# Patient Record
Sex: Female | Born: 2005 | Race: White | Hispanic: No | Marital: Single | Smoking: Never smoker
Health system: Southern US, Community
[De-identification: ages and names within clinical notes are randomized; demographics above are authoritative.]

## PROBLEM LIST (undated history)

## (undated) DIAGNOSIS — Z20828 Contact with and (suspected) exposure to other viral communicable diseases: Secondary | ICD-10-CM

---
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
## 2023-03-23 DIAGNOSIS — J301 Allergic rhinitis due to pollen: Secondary | ICD-10-CM | POA: Insufficient documentation

## 2023-10-16 ENCOUNTER — Ambulatory Visit
Admission: EM | Admit: 2023-10-16 | Discharge: 2023-10-16 | Disposition: A | Discharge status: Home / Self Care | Attending: Emergency Medicine | Admitting: Emergency Medicine

## 2023-10-16 ENCOUNTER — Ambulatory Visit (INDEPENDENT_AMBULATORY_CARE_PROVIDER_SITE_OTHER)

## 2023-10-16 DIAGNOSIS — S92514A Nondisplaced fracture of proximal phalanx of right lesser toe(s), initial encounter for closed fracture: Secondary | ICD-10-CM

## 2023-10-16 DIAGNOSIS — S92421A Displaced fracture of distal phalanx of right great toe, initial encounter for closed fracture: Secondary | ICD-10-CM

## 2023-10-16 DIAGNOSIS — S9031XA Contusion of right foot, initial encounter: Secondary | ICD-10-CM

## 2023-10-16 DIAGNOSIS — T148XXA Other injury of unspecified body region, initial encounter: Secondary | ICD-10-CM

## 2023-10-16 MED ORDER — CEPHALEXIN 500 MG PO CAPS: 1000.0000 mg | ORAL_CAPSULE | Freq: Two times a day (BID) | ORAL | 0 refills | Status: AC

## 2023-10-16 MED ORDER — MUPIROCIN 2 % EX OINT: 1.0000 | TOPICAL_OINTMENT | Freq: Two times a day (BID) | CUTANEOUS | 0 refills | Status: AC

## 2023-10-16 NOTE — ED Provider Notes (Signed)
 HPI  SUBJECTIVE:  Stacey Villarreal is a 18 y.o. female who presents with pain, swelling, bruising of her right 1st and 2nd toes after dropping 300 pounds metal cylinder while wearing soft shoes while at work on 4/18.  She reports a blister that is new on the dorsum of her foot.  She has been taking Naprosyn with fairly good pain control.  She is also using  crutches.  Was seen in the ED/[facility] urgent care on 4/18 on the day of injury.  Per chart, x-ray shows comminuted tuft fracture distal right first phalanx.  Second phalanx normal.  She was referred to orthopedics and she was sent home with Naprosyn, open toed postop shoe.  She states that she cannot wear the postop shoe at work, but needs a boot, which she states has been cleared by work.  This is not a Financial risk analyst case.  Past Medical History:  Diagnosis Date   Mono exposure     History reviewed. No pertinent surgical history.  History reviewed. No pertinent family history.  Social History   Tobacco Use   Smoking status: Never   Smokeless tobacco: Never  Substance Use Topics   Alcohol use: No    No current facility-administered medications for this encounter.  Current Outpatient Medications:    cetirizine (ZYRTEC) 5 MG tablet, Take 5 mg by mouth., Disp: , Rfl:    etonogestrel (NEXPLANON) 68 MG IMPL implant, , Disp: , Rfl:    cefdinir (OMNICEF) 250 MG/5ML suspension, Take by mouth 2 (two) times daily., Disp: , Rfl:   Allergies  Allergen Reactions   Bee Venom Dermatitis     ROS  As noted in HPI.   Physical Exam  BP 112/79 (BP Location: Right Arm)   Pulse 71   Temp 98.7 F (37.1 C) (Oral)   Resp 15   SpO2 97%  *** Constitutional: Well developed, well nourished, no acute distress Eyes:  EOMI, conjunctiva normal bilaterally HENT: Normocephalic, atraumatic,mucus membranes moist Respiratory: Normal inspiratory effort Cardiovascular: Normal rate GI: nondistended skin: Tender traumatic blister dorsum first  right toe Musculoskeletal: Right first MTP, 1st and 2nd toes tender, bruised.  Tenderness on the dorsum and plantar aspect of the foot along 1st and 2nd toes.  Sensation decreased, but intact.  Cap refill less than 2 seconds in all toes.  No other tenderness over the rest of the foot  Neurologic: Alert & oriented x 3, no focal neuro deficits Psychiatric: Speech and behavior appropriate   ED Course   Medications - No data to display  Orders Placed This Encounter  Procedures   DG Foot Complete Right    Standing Status:   Standing    Number of Occurrences:   1    Reason for Exam (SYMPTOM  OR DIAGNOSIS REQUIRED):   Blunt trauma right first MTP, 1st and 2nd toe with extensive bruising.  Rule out fracture.    No results found for this or any previous visit (from the past 24 hours). No results found.  ED Clinical Impression  1. Contusion of right foot, initial encounter      ED Assessment/Plan   {The patient has not been seen in Urgent Care in the last 3 years. :1}   Outside records reviewed.  As noted in HPI.  Unable to actually review images from outside urgent care/ED.  Will repeat films to make sure nothing has changed.  Reviewed imaging independently.,  Comminuted tuft fracture distal phalanx, possible fracture of the base of proximal phalanx  second toe only seen on the lateral view as read by me.  Discussed with patient that formal radiology overread is pending.  Will contact patient at ***if radiology overread differs from mine and we need to change management.  Reviewed radiology report.  Comminuted tuft fracture distal phalanx first toe consistent with my read.  See radiology report for full details.  Patient states the Naprosyn is working well for her.  Will add Tylenol to this.  Keflex  1000 mg p.o. twice daily for 5 days and Bactroban  for the traumatic blister to prevent infection.  Placing in cam walker.  Will put in referral to [facility].  Discussed labs,  imaging, MDM, treatment plan, and plan for follow-up with {Blank single:19197::"family","parent","patient"}. Discussed sn/sx that should prompt return to the ED. {Blank single:19197::"family","parent","patient"} agrees with plan.   No orders of the defined types were placed in this encounter.     *This clinic note was created using Dragon dictation software. Therefore, there may be occasional mistakes despite careful proofreading.  ?

## 2023-10-16 NOTE — ED Triage Notes (Signed)
 Patient dropped 500 lbs of metel on right toe/foot 3 days ago

## 2023-10-16 NOTE — Discharge Instructions (Addendum)
 I believe that you have broken the base of the second toe.  You have definitely broken your big toe.  Wear the cam walker at all times.  Please follow-up with podiatry in a week or 2.  I have put in a referral to them.  Continue Naprosyn COVID 1000 mg of Tylenol to it.  Ice.  Keflex  and Bactroban  to help prevent infection of the blister on your first toe.  Keep this clean with soap and water.  The formal radiology overread is pending.  I will contact you if the radiology overread differs enough from my and we need to change management.

## 2023-10-17 ENCOUNTER — Ambulatory Visit (INDEPENDENT_AMBULATORY_CARE_PROVIDER_SITE_OTHER): Admitting: Podiatry

## 2023-10-17 DIAGNOSIS — S92421A Displaced fracture of distal phalanx of right great toe, initial encounter for closed fracture: Secondary | ICD-10-CM

## 2023-10-17 NOTE — Progress Notes (Signed)
  Subjective:  Patient ID: Stacey Villarreal, female    DOB: [birth year],  MRN: [id number]  Chief Complaint  Patient presents with   Fracture    Right toe fracture  Pt stated that it happened Friday she dropped some metal on it she had xrays yesterday     18 y.o. female presents with the above complaint.  Patient presents with right hallux distal fracture.  Painful to touch has progressive gotten worse worse with ambulation worse with pressure patient states that it just happened on Friday.  She dropped something on the foot.  She went to urgent care who got x-rays and sent her here she is currently wearing a boot denies any other acute complaints   Review of Systems: Negative except as noted in the HPI. Denies N/V/F/Ch.  Past Medical History:  Diagnosis Date   Mono exposure     Current Outpatient Medications:    Cholecalciferol (D 1000) 25 MCG (1000 UT) capsule, Take 1,000 Units by mouth., Disp: , Rfl:    cyanocobalamin (VITAMIN B12) 1000 MCG tablet, Take 1,000 mcg by mouth., Disp: , Rfl:    desogestrel-ethinyl estradiol (APRI) 0.15-30 MG-MCG tablet, Take 1 tablet by mouth daily., Disp: , Rfl:    naproxen (NAPROSYN) 500 MG tablet, Take 500 mg by mouth., Disp: , Rfl:    cephALEXin  (KEFLEX ) 500 MG capsule, Take 2 capsules (1,000 mg total) by mouth 2 (two) times daily for 5 days., Disp: 20 capsule, Rfl: 0   cetirizine (ZYRTEC) 5 MG tablet, Take 5 mg by mouth., Disp: , Rfl:    etonogestrel (NEXPLANON) 68 MG IMPL implant, , Disp: , Rfl:    fluticasone (FLONASE) 50 MCG/ACT nasal spray, Place 2 sprays into the nose., Disp: , Rfl:    mupirocin  ointment (BACTROBAN ) 2 %, Apply 1 Application topically 2 (two) times daily., Disp: 22 g, Rfl: 0  Social History   Tobacco Use  Smoking Status Never  Smokeless Tobacco Never    Allergies  Allergen Reactions   Bee Venom Dermatitis   Objective:  There were no vitals filed for this visit. There is no height or weight on file to calculate  BMI. Constitutional Well developed. Well nourished.  Vascular Dorsalis pedis pulses palpable bilaterally. Posterior tibial pulses palpable bilaterally. Capillary refill normal to all digits.  No cyanosis or clubbing noted. Pedal hair growth normal.  Neurologic Normal speech. Oriented to person, place, and time. Epicritic sensation to light touch grossly present bilaterally.  Dermatologic Nails well groomed and normal in appearance. No open wounds. No skin lesions.  Orthopedic: Pain on palpation to the right great toe.  Some ecchymosis bruising noted.  No open wounds or lesion noted.  Good alignment noted.   Radiographs: None Assessment:   1. Closed displaced fracture of distal phalanx of right great toe, initial encounter    Plan:  Patient was evaluated and treated and all questions answered.  Right hallux distal phalanx fracture - All questions and concerns were discussed with the patient extensive detail at this time the fracture fragments are in good alignment and position.  It is all primarily extra-articular as well.  I discussed with the patient to continue buddy splinting and wearing cam boot for another 4 weeks she states understanding if any foot and ankle issues arise in the future she will come back and see me.  No follow-ups on file.

## 2024-05-15 ENCOUNTER — Ambulatory Visit

## 2024-05-16 ENCOUNTER — Ambulatory Visit
Admission: RE | Admit: 2024-05-16 | Discharge: 2024-05-16 | Disposition: A | Discharge status: Home / Self Care | Attending: Emergency Medicine | Admitting: Emergency Medicine

## 2024-05-16 ENCOUNTER — Ambulatory Visit

## 2024-05-16 VITALS — BP 124/80 | HR 66 | Temp 98.0°F | Resp 16 | Ht 64.0 in | Wt 165.0 lb

## 2024-05-16 DIAGNOSIS — R300 Dysuria: Secondary | ICD-10-CM | POA: Diagnosis not present

## 2024-05-16 DIAGNOSIS — N390 Urinary tract infection, site not specified: Secondary | ICD-10-CM | POA: Diagnosis not present

## 2024-05-16 LAB — POCT URINE DIPSTICK
Bilirubin, UA: NEGATIVE
Glucose, UA: 100 mg/dL — AB
Ketones, POC UA: NEGATIVE mg/dL
Nitrite, UA: POSITIVE — AB
POC PROTEIN,UA: NEGATIVE
Spec Grav, UA: <=1.005 — AB (ref 1.010–1.025)
Urobilinogen, UA: 1 U/dL
pH, UA: 5.5 (ref 5.0–8.0)

## 2024-05-16 MED ORDER — PHENAZOPYRIDINE HCL 200 MG PO TABS: 200.0000 mg | ORAL_TABLET | Freq: Three times a day (TID) | ORAL | 0 refills | Status: AC

## 2024-05-16 MED ORDER — NITROFURANTOIN MONOHYD MACRO 100 MG PO CAPS: 100.0000 mg | ORAL_CAPSULE | Freq: Two times a day (BID) | ORAL | 0 refills | Status: AC

## 2024-05-16 NOTE — ED Triage Notes (Signed)
 Pt c/o burning with urination and frequency x's 3-4 days  Also c/o nausea

## 2024-05-16 NOTE — ED Provider Notes (Addendum)
 [facility]    CSN: [id number] Arrival date & time: 05/16/24  1153      History   Chief Complaint Chief Complaint  Patient presents with   Urinary Frequency    I believe I have a uti - Entered by patient    HPI Stacey Villarreal is a 18 y.o. female.   HPI  18 year old female with past medical history significant for nonseasonal allergic rhinitis presents for evaluation of burning with urination and urinary frequency that started 3 to 4 days ago.  This is associated with some nausea, low back pain, and lower abdominal pain.  No fever, blood in urine, vaginal discharge, vaginal itching.  Past Medical History:  Diagnosis Date   Mono exposure     Patient Active Problem List   Diagnosis Date Noted   Non-seasonal allergic rhinitis due to pollen 03/23/2023    History reviewed. No pertinent surgical history.  OB History   No obstetric history on file.      Home Medications    Prior to Admission medications   Medication Sig Start Date End Date Taking? Authorizing Provider  nitrofurantoin , macrocrystal-monohydrate, (MACROBID ) 100 MG capsule Take 1 capsule (100 mg total) by mouth 2 (two) times daily. 05/16/24  Yes Bernardino Ditch, NP  phenazopyridine  (PYRIDIUM ) 200 MG tablet Take 1 tablet (200 mg total) by mouth 3 (three) times daily. 05/16/24  Yes Bernardino Ditch, NP  cetirizine (ZYRTEC) 5 MG tablet Take 5 mg by mouth.    [provider]  Cholecalciferol (D 1000) 25 MCG (1000 UT) capsule Take 1,000 Units by mouth. 03/27/23   [provider]  desogestrel-ethinyl estradiol (APRI) 0.15-30 MG-MCG tablet Take 1 tablet by mouth daily. 08/29/23 08/28/24  [provider]  etonogestrel (NEXPLANON) 68 MG IMPL implant  12/02/22   [provider]  fluticasone (FLONASE) 50 MCG/ACT nasal spray Place 2 sprays into the nose.    [provider]  mupirocin  ointment (BACTROBAN ) 2 % Apply 1 Application topically 2 (two) times daily. 10/16/23    Van Knee, MD    Family History History reviewed. No pertinent family history.  Social History Social History   Tobacco Use   Smoking status: Never   Smokeless tobacco: Never  Vaping Use   Vaping status: Never Used  Substance Use Topics   Alcohol use: No     Allergies   Bee venom   Review of Systems Review of Systems  Constitutional:  Negative for fever.  Gastrointestinal:  Positive for abdominal pain and nausea. Negative for vomiting.  Genitourinary:  Positive for dysuria, frequency and urgency. Negative for hematuria, vaginal discharge and vaginal pain.  Musculoskeletal:  Positive for back pain.     Physical Exam Triage Vital Signs ED Triage Vitals  Encounter Vitals Group     BP      Girls Systolic BP Percentile      Girls Diastolic BP Percentile      Boys Systolic BP Percentile      Boys Diastolic BP Percentile      Pulse      Resp      Temp      Temp src      SpO2      Weight      Height      Head Circumference      Peak Flow      Pain Score      Pain Loc      Pain Education      Exclude  from Growth Chart    No data found.  Updated Vital Signs BP 124/80 (BP Location: Left Arm)   Pulse 66   Temp 98 F (36.7 C) (Oral)   Resp 16   Ht 5' 4 (1.626 m)   Wt 165 lb (74.8 kg)   LMP 05/09/2024 (Exact Date)   SpO2 99%   BMI 28.32 kg/m   Visual Acuity Right Eye Distance:   Left Eye Distance:   Bilateral Distance:    Right Eye Near:   Left Eye Near:    Bilateral Near:     Physical Exam Vitals and nursing note reviewed.  Constitutional:      Appearance: Normal appearance. She is not ill-appearing.  HENT:     Head: Normocephalic and atraumatic.  Cardiovascular:     Rate and Rhythm: Normal rate and regular rhythm.     Pulses: Normal pulses.     Heart sounds: Normal heart sounds. No murmur heard.    No friction rub. No gallop.  Pulmonary:     Effort: Pulmonary effort is normal.     Breath sounds: Normal breath sounds. No  wheezing, rhonchi or rales.  Abdominal:     General: Abdomen is flat.     Palpations: Abdomen is soft.     Tenderness: There is abdominal tenderness. There is no right CVA tenderness, left CVA tenderness, guarding or rebound.     Comments: Lower abdominal tenderness without focal finding.  Skin:    General: Skin is warm and dry.     Capillary Refill: Capillary refill takes less than 2 seconds.     Findings: No erythema or rash.  Neurological:     General: No focal deficit present.     Mental Status: She is alert and oriented to person, place, and time.      UC Treatments / Results  Labs (all labs ordered are listed, but only abnormal results are displayed) Labs Reviewed  POCT URINE DIPSTICK - Abnormal; Notable for the following components:      Result Value   Color, UA orange (*)    Glucose, UA =100 (*)    Spec Grav, UA <=1.005 (*)    Blood, UA trace-intact (*)    Nitrite, UA Positive (*)    Leukocytes, UA Large (3+) (*)    All other components within normal limits  URINE CULTURE    EKG   Radiology No results found.  Procedures Procedures (including critical care time)  Medications Ordered in UC Medications - No data to display  Initial Impression / Assessment and Plan / UC Course  I have reviewed the triage vital signs and the nursing notes.  Pertinent labs & imaging results that were available during my care of the patient were reviewed by me and considered in my medical decision making (see chart for details).   Patient is a nontoxic-appearing 18 year old female presenting for evaluation of UTI symptoms that started 3 to 4 days ago.  She reports that she has had UTIs in the past but they are not frequent.  She denies any vaginal symptoms.  On exam she has no CVA tenderness but she does have mild lower abdominal tenderness without guarding or rebound.  I will order urinalysis to assess for the presence of UTI.  Urinalysis showsOrange color with 100 glucose, low  specific gravity, intact trace RBCs, nitrite positive with large leukocyte esterase.  I will send urine for culture.  I will discharge home with diagnosis of UTI and start her on  Macrobid  100 mg twice daily for 7 Pyridium  every 8 hours help with urinary discomfort.  I will encourage her to increase her oral fluid intake to increase her urine production to help flush her urinary tract.  Urine culture grew out E. coli.  No change in therapy at this time.   Final Clinical Impressions(s) / UC Diagnoses   Final diagnoses:  Dysuria  Lower urinary tract infectious disease     Discharge Instructions      Take the Macrobid  twice daily for 5 days with food for treatment of urinary tract infection.  Use the Pyridium  every 8 hours as needed for urinary discomfort.  This will turn your urine a bright red-orange.  Increase your oral fluid intake so that you increase your urine production and or flushing your urinary system.  Take an over-the-counter probiotic, such as Culturelle-Align-Activia, 1 hour after each dose of antibiotic to prevent diarrhea or yeast infections from forming.  We will culture urine and change the antibiotics if necessary.  Return for reevaluation, or see your primary care provider, for any new or worsening symptoms.      ED Prescriptions     Medication Sig Dispense Auth. Provider   nitrofurantoin , macrocrystal-monohydrate, (MACROBID ) 100 MG capsule Take 1 capsule (100 mg total) by mouth 2 (two) times daily. 10 capsule Bernardino Ditch, NP   phenazopyridine  (PYRIDIUM ) 200 MG tablet Take 1 tablet (200 mg total) by mouth 3 (three) times daily. 6 tablet Bernardino Ditch, NP      PDMP not reviewed this encounter.   Bernardino Ditch, NP 05/16/24 1301    Bernardino Ditch, NP 05/18/24 [phone]

## 2024-05-16 NOTE — Discharge Instructions (Addendum)

## 2024-05-18 LAB — URINE CULTURE
Culture: 40000 — AB
Special Requests: NORMAL

## 2024-05-20 ENCOUNTER — Ambulatory Visit (HOSPITAL_COMMUNITY): Payer: Self-pay
